# Patient Record
Sex: Male | Born: 1980 | Race: Black or African American | Hispanic: No | Marital: Married | Smoking: Former smoker
Health system: Southern US, Community
[De-identification: ages and names within clinical notes are randomized; demographics above are authoritative.]

---

## 2004-10-30 ENCOUNTER — Emergency Department: Payer: Self-pay | Admitting: Emergency Medicine

## 2006-09-12 ENCOUNTER — Emergency Department: Payer: Self-pay | Admitting: Emergency Medicine

## 2020-07-02 ENCOUNTER — Emergency Department: Payer: BC Managed Care – PPO

## 2020-07-02 ENCOUNTER — Encounter: Payer: Self-pay | Admitting: Emergency Medicine

## 2020-07-02 ENCOUNTER — Emergency Department
Admission: EM | Admit: 2020-07-02 | Discharge: 2020-07-02 | Disposition: A | Discharge status: Home / Self Care | Payer: BC Managed Care – PPO | Attending: Emergency Medicine | Admitting: Emergency Medicine

## 2020-07-02 ENCOUNTER — Other Ambulatory Visit: Payer: Self-pay

## 2020-07-02 DIAGNOSIS — I10 Essential (primary) hypertension: Secondary | ICD-10-CM | POA: Diagnosis not present

## 2020-07-02 DIAGNOSIS — R0602 Shortness of breath: Secondary | ICD-10-CM | POA: Diagnosis not present

## 2020-07-02 DIAGNOSIS — R079 Chest pain, unspecified: Secondary | ICD-10-CM | POA: Diagnosis present

## 2020-07-02 LAB — CBC
HCT: 47.3 % (ref 39.0–52.0)
Hemoglobin: 15.6 g/dL (ref 13.0–17.0)
MCH: 27 pg (ref 26.0–34.0)
MCHC: 33 g/dL (ref 30.0–36.0)
MCV: 82 fL (ref 80.0–100.0)
Platelets: 396 10*3/uL (ref 150–400)
RBC: 5.77 MIL/uL (ref 4.22–5.81)
RDW: 13.6 % (ref 11.5–15.5)
WBC: 7.4 10*3/uL (ref 4.0–10.5)
nRBC: 0 % (ref 0.0–0.2)

## 2020-07-02 LAB — BASIC METABOLIC PANEL
Anion gap: 11 (ref 5–15)
BUN: 10 mg/dL (ref 6–20)
CO2: 25 mmol/L (ref 22–32)
Calcium: 9.5 mg/dL (ref 8.9–10.3)
Chloride: 99 mmol/L (ref 98–111)
Creatinine, Ser: 1.16 mg/dL (ref 0.61–1.24)
GFR calc Af Amer: >60 mL/min (ref >60)
GFR calc non Af Amer: >60 mL/min (ref >60)
Glucose, Bld: 144 mg/dL — ABNORMAL HIGH (ref 70–99)
Potassium: 3.8 mmol/L (ref 3.5–5.1)
Sodium: 135 mmol/L (ref 135–145)

## 2020-07-02 LAB — TROPONIN I (HIGH SENSITIVITY)
Troponin I (High Sensitivity): 39 ng/L — ABNORMAL HIGH (ref <18)
Troponin I (High Sensitivity): 39 ng/L — ABNORMAL HIGH (ref <18)

## 2020-07-02 LAB — BRAIN NATRIURETIC PEPTIDE: B Natriuretic Peptide: 270.1 pg/mL — ABNORMAL HIGH (ref 0.0–100.0)

## 2020-07-02 MED ORDER — METOPROLOL TARTRATE 50 MG PO TABS
50.0000 mg | ORAL_TABLET | Freq: Once | ORAL | Status: AC
  Administered 2020-07-02: 50 mg via ORAL
  Filled 2020-07-02: qty 1

## 2020-07-02 MED ORDER — METOPROLOL SUCCINATE ER 50 MG PO TB24: 50.0000 mg | ORAL_TABLET | Freq: Every day | ORAL | 11 refills | Status: AC

## 2020-07-02 MED ORDER — IOHEXOL 350 MG/ML SOLN
75.0000 mL | Freq: Once | INTRAVENOUS | Status: AC | PRN
  Administered 2020-07-02: 75 mL via INTRAVENOUS

## 2020-07-02 MED ORDER — LABETALOL HCL 5 MG/ML IV SOLN
20.0000 mg | Freq: Once | INTRAVENOUS | Status: AC
  Administered 2020-07-02: 20 mg via INTRAVENOUS
  Filled 2020-07-02: qty 4

## 2020-07-02 MED ORDER — METOPROLOL TARTRATE 5 MG/5ML IV SOLN
5.0000 mg | Freq: Once | INTRAVENOUS | Status: AC
  Administered 2020-07-02: 5 mg via INTRAVENOUS
  Filled 2020-07-02: qty 5

## 2020-07-02 NOTE — ED Triage Notes (Signed)
Pt reports Tuesday he started getting an uncomfortable feeling in his chest/mid epigastric area when he lays flat. Pt denies pain, states that it is an off feeling and he can't explain it. Pt denies SOB, NV or other sx's.

## 2020-07-02 NOTE — ED Provider Notes (Signed)
[facility] Emergency Department Provider Note  Time seen: 12:57 PM  I have reviewed the triage vital signs and the nursing notes.   HISTORY  Chief Complaint Chest Pain   HPI Ronald Nguyen is a 39 y.o. male with no past medical history who presents to the emergency department for shortness of breath.  According to the patient over the past 2 to 3 days he has been feeling more short of breath especially with exertion or if he tries to lie down.  States for the last 2 nights he has been sleeping in a recliner due to shortness of breath when lying down.  Patient denies any chest pain.  Denies abdominal pain denies lower extremity pain or swelling.  Patient is somewhat tachycardic around 130 bpm upon arrival as well.  Denies any fever or cough.  Patient states a history of hypertension but does not take any medications nor sees a PCP.   History reviewed. No pertinent past medical history.  There are no problems to display for this patient.   History reviewed. No pertinent surgical history.  Prior to Admission medications   Not on File    Not on File  No family history on file.  Social History Social History   Tobacco Use  . Smoking status: Not on file  Substance Use Topics  . Alcohol use: Not on file  . Drug use: Not on file    Review of Systems Constitutional: Negative for fever Cardiovascular: Negative for chest pain. Respiratory: Worsening shortness of breath x3 to 4 days. Gastrointestinal: Negative for abdominal pain, vomiting Musculoskeletal: Negative for musculoskeletal complaints Neurological: Negative for headache All other ROS negative  ____________________________________________   PHYSICAL EXAM:  VITAL SIGNS: ED Triage Vitals  Enc Vitals Group     BP 07/02/20 0823 (!) 186/128     Pulse Rate 07/02/20 0823 (!) 142     Resp 07/02/20 0823 20     Temp 07/02/20 0823 97.7 F (36.5 C)     Temp Source 07/02/20 0823 Oral     SpO2  07/02/20 0823 99 %     Weight 07/02/20 0821 196 lb (88.9 kg)     Height 07/02/20 0821 5\' 6"  (1.676 m)     Head Circumference --      Peak Flow --      Pain Score 07/02/20 0821 0     Pain Loc --      Pain Edu? --      Excl. in GC? --     Constitutional: Alert and oriented. Well appearing and in no distress. Eyes: Normal exam ENT      Head: Normocephalic and atraumatic.      Mouth/Throat: Mucous membranes are moist. Cardiovascular: Normal rate, regular rhythm.  Respiratory: Normal respiratory effort without tachypnea nor retractions. Breath sounds are clear  Gastrointestinal: Soft and nontender. No distention.   Musculoskeletal: Nontender with normal range of motion in all extremities.  Neurologic:  Normal speech and language. No gross focal neurologic deficits  Skin:  Skin is warm, dry and intact.  Psychiatric: Mood and affect are normal.   ____________________________________________    EKG  EKG viewed and interpreted by myself shows sinus tachycardia 134 bpm with a narrow QRS, normal axis, normal intervals nonspecific ST changes.  ____________________________________________    RADIOLOGY  Chest x-ray is negative. CT scan of the chest shows no PE.  ____________________________________________   INITIAL IMPRESSION / ASSESSMENT AND PLAN / ED COURSE  Pertinent labs & imaging  results that were available during my care of the patient were reviewed by me and considered in my medical decision making (see chart for details).   Patient presents emergency department for shortness of breath worse over the past 3 to 4 days.  Patient's work-up shows a slightly elevated troponin however on repeat troponin it is largely unchanged 2 hours later.  Patient's chest x-ray is clear however given his tachycardia and shortness of breath we will proceed with CTA imaging to rule out PE or interstitial edema.  Patient CT scan is negative remainder of the lab work is largely nonrevealing.   Patient's blood pressure remains significantly elevated 180/130.  Highly suspect the patient's shortness of breath is due to significant diastolic elevation with increased afterload and demand ischemia.  I spoke to Dr. Lady Gary of cardiology who agrees.  Wishes to start the patient on 50 mg of Toprol-XL daily and have the patient follow-up tomorrow morning at 10 AM in his office.  I spoke to the patient he is agreeable to this plan of care.  I discussed return precautions as well.  CT does show a 5 mm nodule, patient is low risk no follow-up required.  Ronald Nguyen was evaluated in Emergency Department on 07/02/2020 for the symptoms described in the history of present illness. He was evaluated in the context of the global COVID-19 pandemic, which necessitated consideration that the patient might be at risk for infection with the SARS-CoV-2 virus that causes COVID-19. Institutional protocols and algorithms that pertain to the evaluation of patients at risk for COVID-19 are in a state of rapid change based on information released by regulatory bodies including the CDC and federal and state organizations. These policies and algorithms were followed during the patient's care in the ED.  ____________________________________________   FINAL CLINICAL IMPRESSION(S) / ED DIAGNOSES  Hypertension   Minna Antis, MD 07/02/20 1302

## 2020-07-02 NOTE — ED Notes (Signed)
Patient transported to CT 

## 2020-07-03 DIAGNOSIS — I10 Essential (primary) hypertension: Secondary | ICD-10-CM

## 2020-07-03 HISTORY — DX: Essential (primary) hypertension: I10

## 2020-08-06 DIAGNOSIS — I429 Cardiomyopathy, unspecified: Secondary | ICD-10-CM

## 2020-08-06 HISTORY — DX: Cardiomyopathy, unspecified: I42.9

## 2021-05-04 IMAGING — CR DG CHEST 2V
2 series · 2 of 2 positions shown · non-contrast
Comparison: None.

CLINICAL DATA: Chest discomfort

EXAM:
CHEST - 2 VIEW

[chest pa]
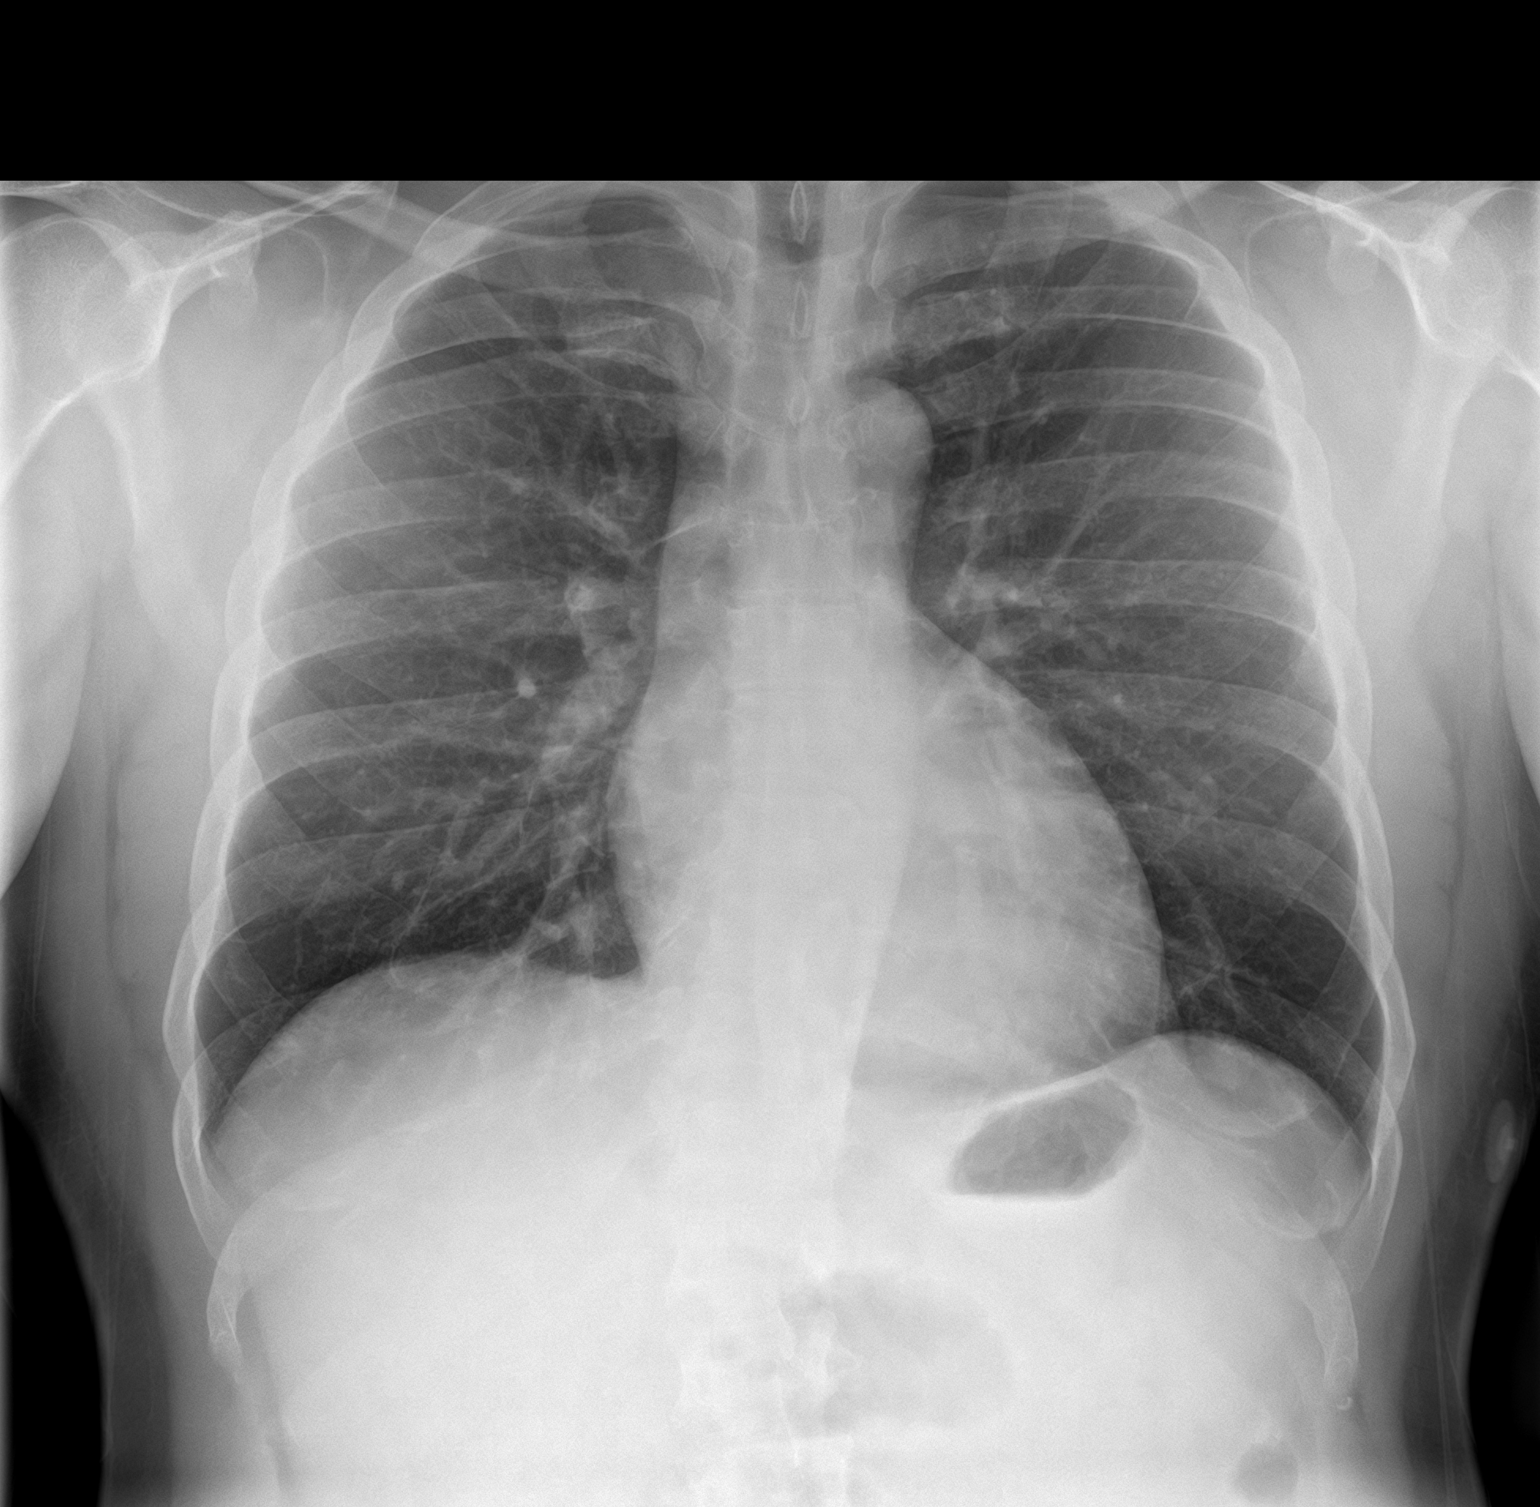

[chest lat]
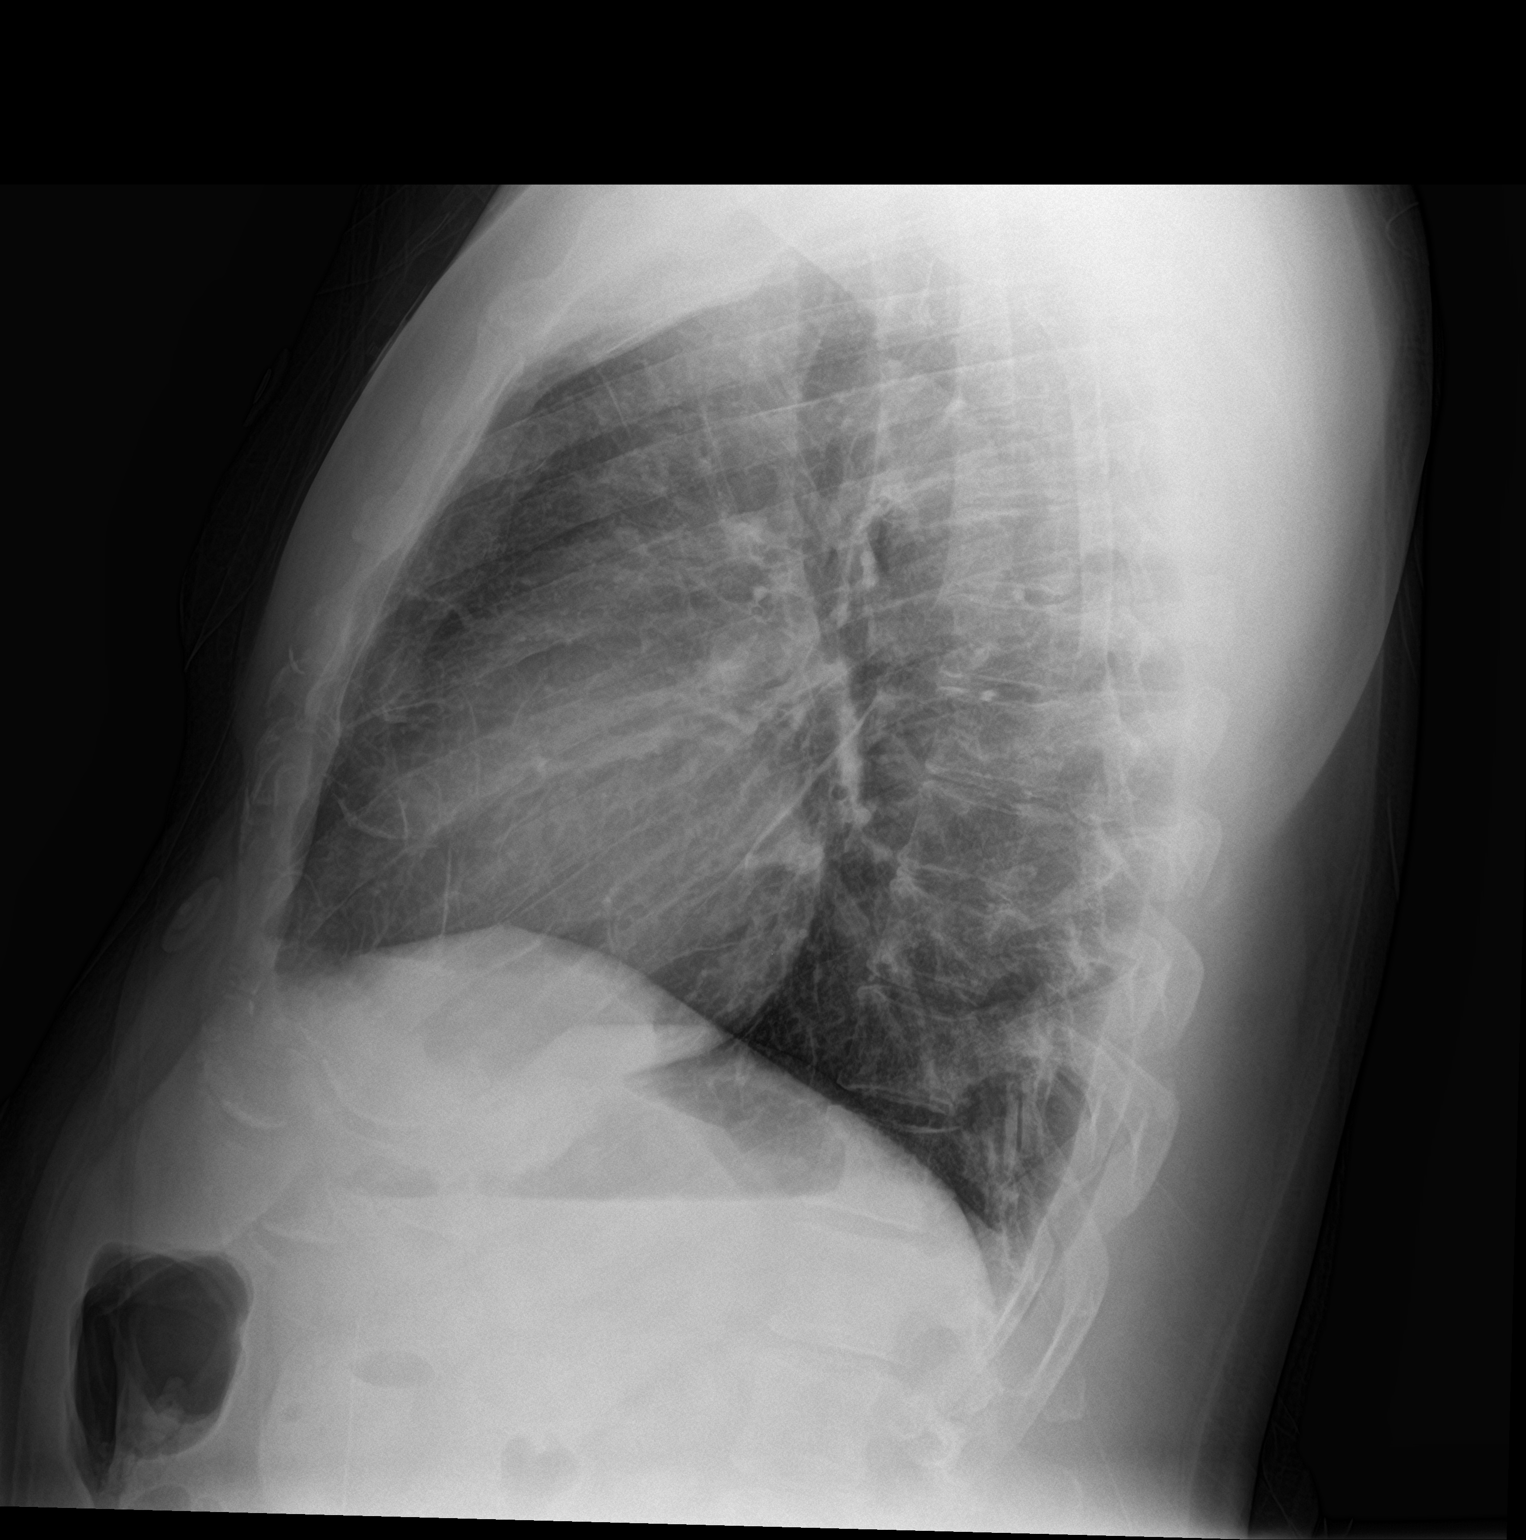

[2 of 2 positions shown; findings below may reference images not displayed]

FINDINGS: Linear densities in the lung bases, likely scarring. No confluent
opacities, effusions or acute bony abnormality. Heart is normal
size.
IMPRESSION: No active disease.

## 2021-05-04 IMAGING — CT CT ANGIO CHEST
2 of 7 series · 19 of 46 positions shown · IV contrast (APPLIED)
Comparison: None.

CLINICAL DATA: Chest pain.

EXAM:
CT ANGIOGRAPHY CHEST WITH CONTRAST
TECHNIQUE: Multidetector CT imaging of the chest was performed using the
standard protocol during bolus administration of intravenous
contrast. Multiplanar CT image reconstructions and MIPs were
obtained to evaluate the vascular anatomy.
CONTRAST:  75mL OMNIPAQUE IOHEXOL 350 MG/ML SOLN

[Series 5: thins · axial · 0.63mm/px · z∈[+24,+280]mm · 16 of 289 slices shown]
[im 17/289  lung]
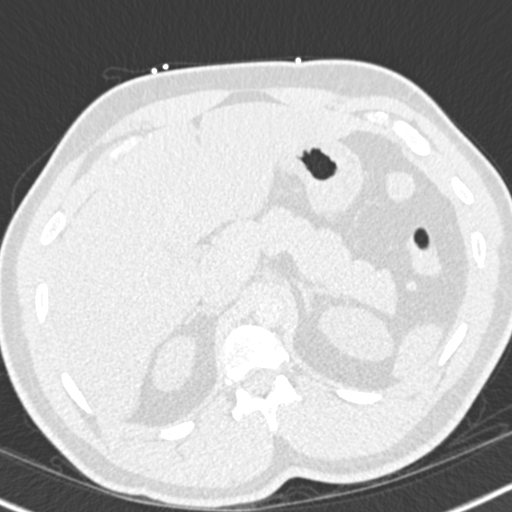
[im 33/289  soft-tissue]
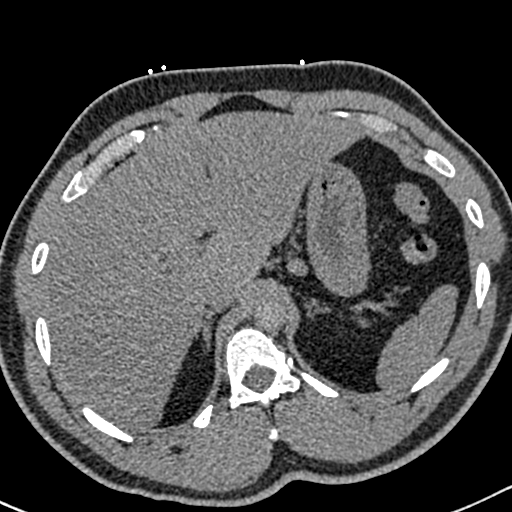
[im 49/289  lung]
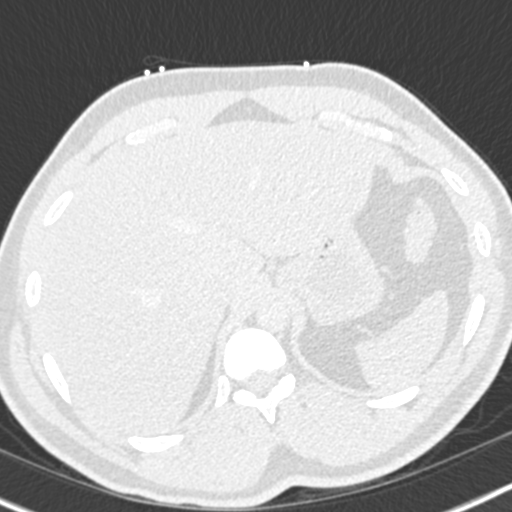
[im 65/289  soft-tissue]
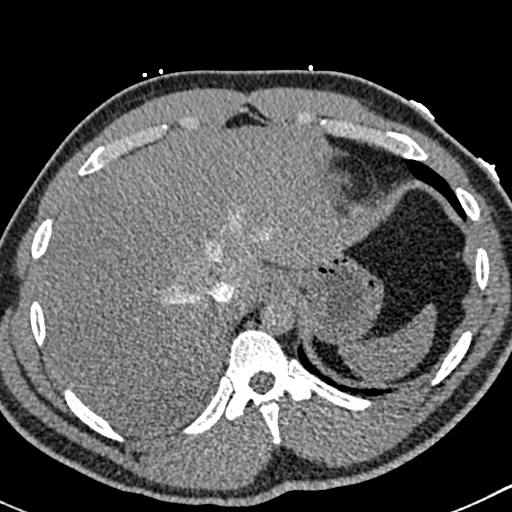
[im 81/289  lung]
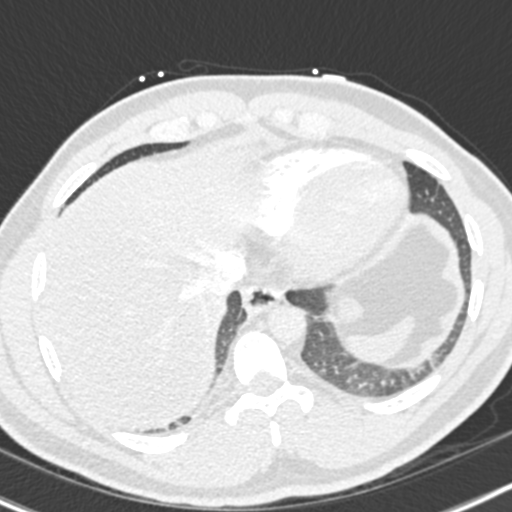
[im 97/289  soft-tissue]
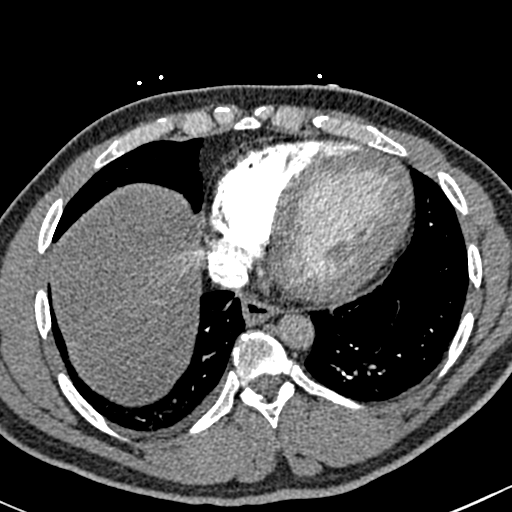
[im 113/289  lung]
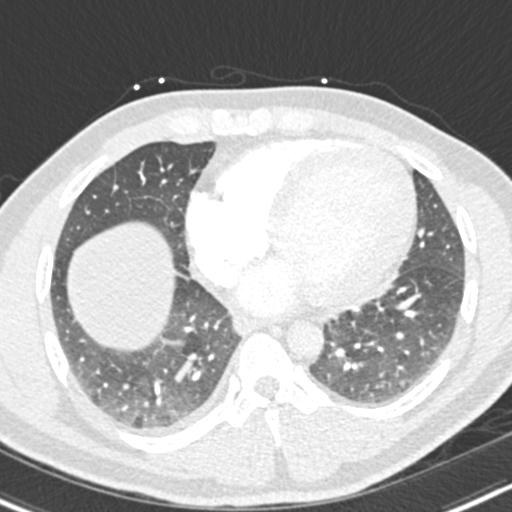
[im 129/289  soft-tissue]
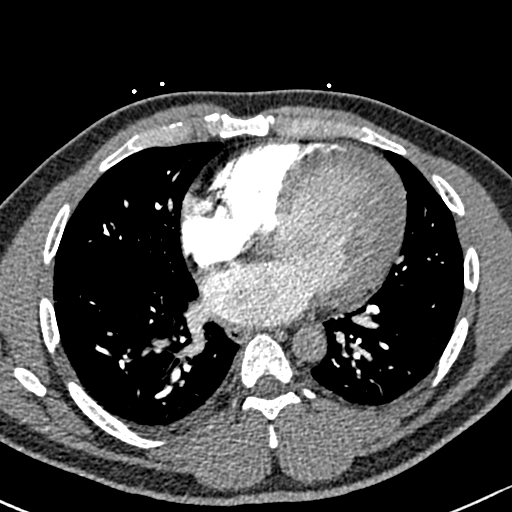
[im 161/289  lung]
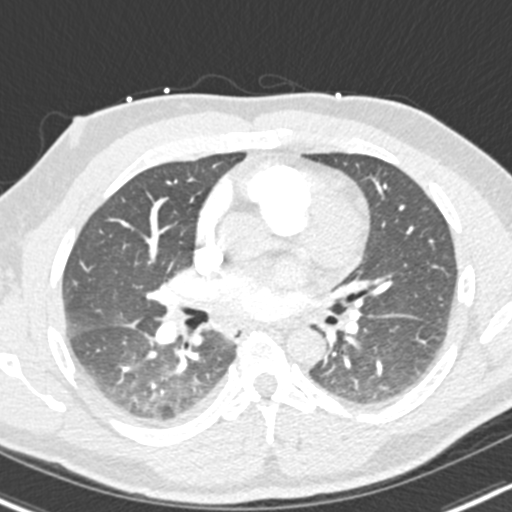
[im 177/289  soft-tissue]
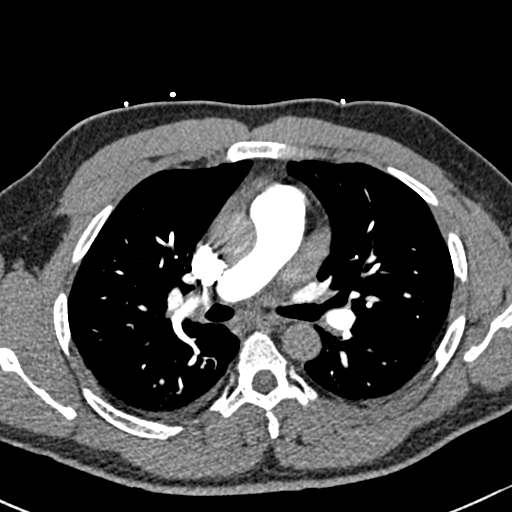
[im 193/289  lung]
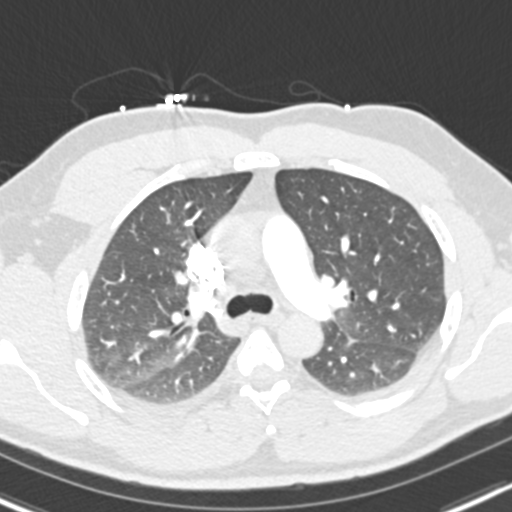
[im 209/289  soft-tissue]
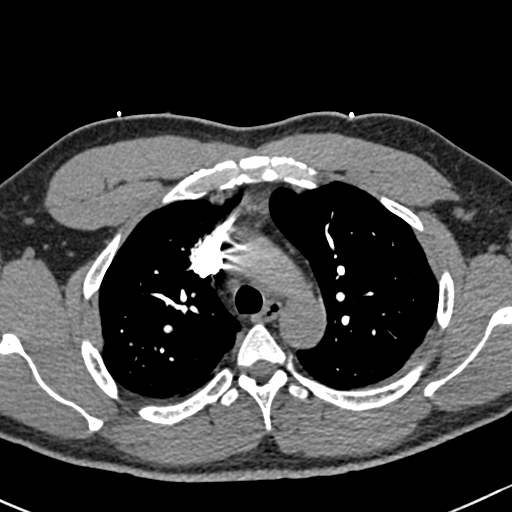
[im 225/289  lung]
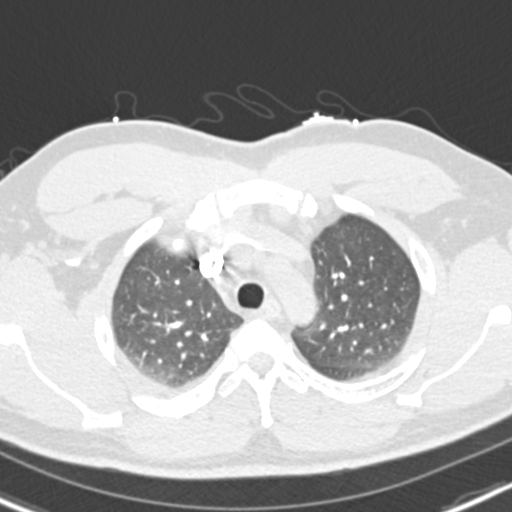
[im 241/289  soft-tissue]
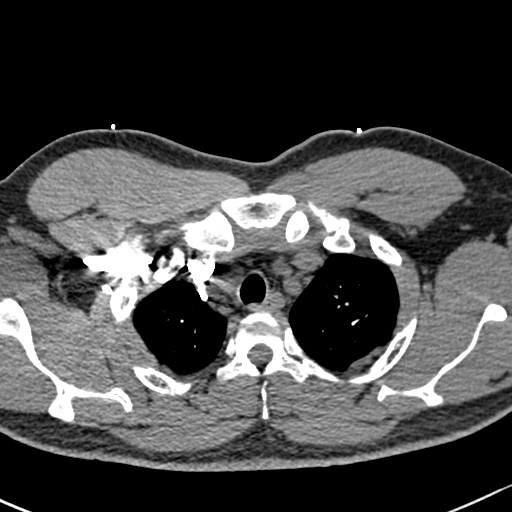
[im 257/289  lung]
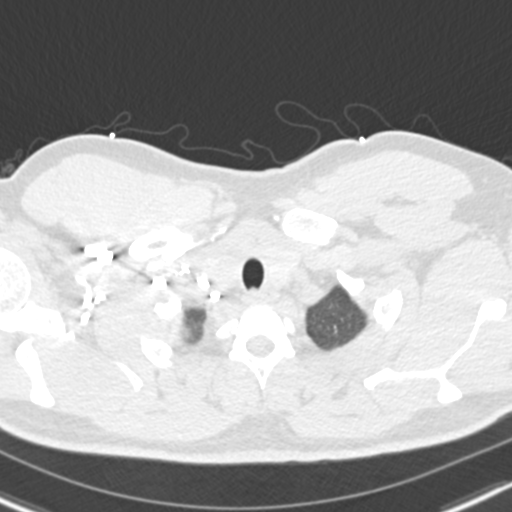
[im 273/289  soft-tissue]
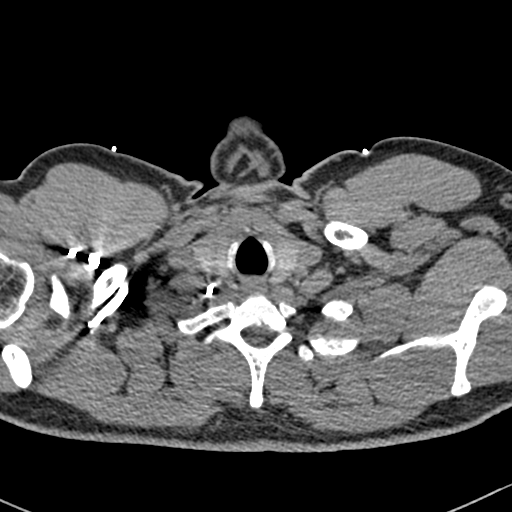

[Series 7: coronal mpr · coronal · 0.56mm/px · 3 of 87 slices shown]
[im 22/87  soft-tissue]
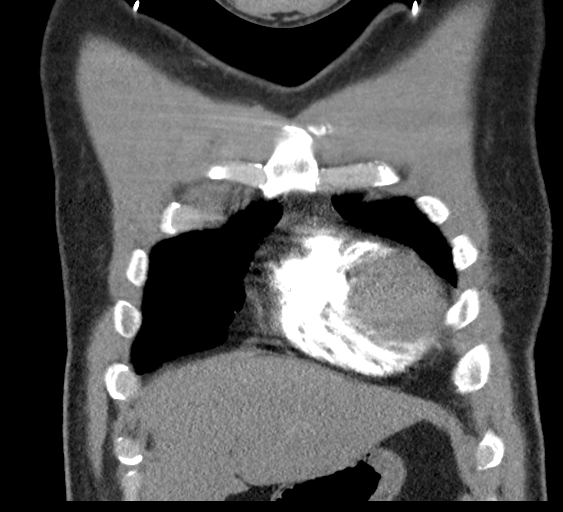
[im 44/87  soft-tissue]
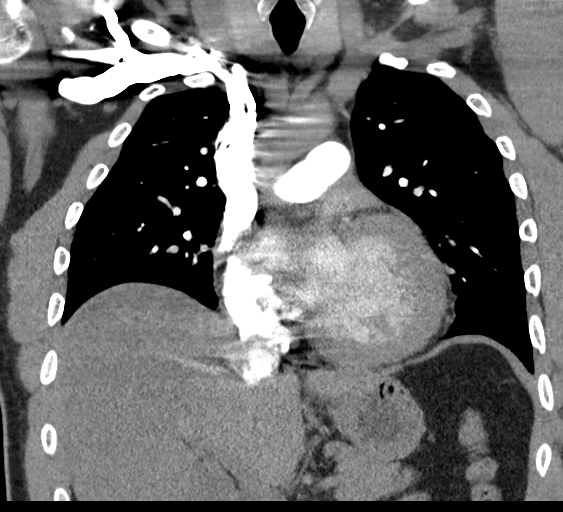
[im 65/87  soft-tissue]
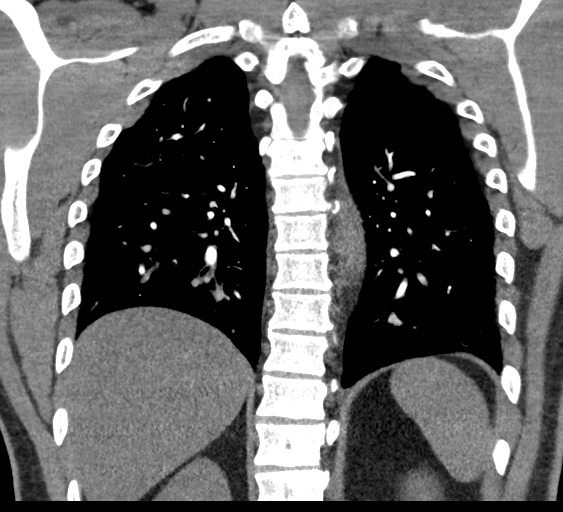

[19 of 46 positions shown; findings below may reference images not displayed]

FINDINGS: Cardiovascular: Satisfactory opacification of the pulmonary arteries
to the segmental level. No evidence of pulmonary embolism. Normal
heart size. No pericardial effusion.

Mediastinum/Nodes: No enlarged mediastinal, hilar, or axillary lymph
nodes. Thyroid gland, trachea, and esophagus demonstrate no
significant findings.

Lungs/Pleura: No pneumothorax or pleural effusion is noted. 5 mm
nodule is noted in right middle lobe best seen on image number 52 of
series 6. Left lung is clear.

Upper Abdomen: No acute abnormality.

Musculoskeletal: No chest wall abnormality. No acute or significant
osseous findings.

Review of the MIP images confirms the above findings.
IMPRESSION: 1. No definite evidence of pulmonary embolus.
2. 5 mm right middle lobe nodule is noted. No follow-up needed if
patient is low-risk. Non-contrast chest CT can be considered in 12
months if patient is high-risk. This recommendation follows the
consensus statement: Guidelines for Management of Incidental
Pulmonary Nodules Detected on CT Images: From the [HOSPITAL]

## 2022-06-22 LAB — MICROALBUMIN / CREATININE URINE RATIO

## 2022-06-22 LAB — PROTEIN / CREATININE RATIO, URINE
Albumin, U: 30
Creatinine, Urine: 100

## 2022-09-21 LAB — HEPATIC FUNCTION PANEL
ALT: 32 U/L (ref 10–40)
AST: 26 (ref 14–40)
Alkaline Phosphatase: 74 (ref 25–125)
Bilirubin, Total: 0.4

## 2022-09-21 LAB — CBC AND DIFFERENTIAL
HCT: 40 — AB (ref 41–53)
Hemoglobin: 13.8 (ref 13.5–17.5)
Neutrophils Absolute: 2.4
Platelets: 325 10*3/uL (ref 150–400)
WBC: 4.1

## 2022-09-21 LAB — COMPREHENSIVE METABOLIC PANEL
Albumin: 5.1 — AB (ref 3.5–5.0)
Calcium: 9.8 (ref 8.7–10.7)
Globulin: 3.1
eGFR: 88

## 2022-09-21 LAB — BASIC METABOLIC PANEL
BUN: 9 (ref 4–21)
CO2: 23 — AB (ref 13–22)
Chloride: 99 (ref 99–108)
Creatinine: 1.1 (ref 0.6–1.3)
Glucose: 119
Potassium: 4.6 mEq/L (ref 3.5–5.1)
Sodium: 136 — AB (ref 137–147)

## 2022-09-21 LAB — CBC: RBC: 4.97 (ref 3.87–5.11)

## 2022-09-21 LAB — LIPID PANEL
Cholesterol: 152 (ref 0–200)
HDL: 30 — AB (ref 35–70)
LDL Cholesterol: 97
Triglycerides: 140 (ref 40–160)

## 2022-09-21 LAB — HEMOGLOBIN A1C: Hemoglobin A1C: 7.2

## 2022-09-21 LAB — VITAMIN D 25 HYDROXY (VIT D DEFICIENCY, FRACTURES): Vit D, 25-Hydroxy: 54.6

## 2022-12-07 ENCOUNTER — Encounter: Payer: Self-pay | Admitting: Family

## 2022-12-07 ENCOUNTER — Ambulatory Visit
Admission: RE | Admit: 2022-12-07 | Discharge: 2022-12-07 | Disposition: A | Discharge status: Home / Self Care | Payer: BC Managed Care – PPO | Attending: Family | Admitting: Family

## 2022-12-07 ENCOUNTER — Ambulatory Visit
Admission: RE | Admit: 2022-12-07 | Discharge: 2022-12-07 | Disposition: A | Discharge status: Home / Self Care | Payer: BC Managed Care – PPO | Source: Ambulatory Visit | Attending: Family | Admitting: Family

## 2022-12-07 ENCOUNTER — Ambulatory Visit: Payer: BC Managed Care – PPO | Admitting: Family

## 2022-12-07 VITALS — BP 132/78 | HR 74 | Ht 66.0 in | Wt 199.0 lb

## 2022-12-07 DIAGNOSIS — R051 Acute cough: Secondary | ICD-10-CM | POA: Diagnosis not present

## 2022-12-07 DIAGNOSIS — R0600 Dyspnea, unspecified: Secondary | ICD-10-CM | POA: Diagnosis not present

## 2022-12-07 DIAGNOSIS — Z6832 Body mass index (BMI) 32.0-32.9, adult: Secondary | ICD-10-CM

## 2022-12-07 DIAGNOSIS — E782 Mixed hyperlipidemia: Secondary | ICD-10-CM

## 2022-12-07 DIAGNOSIS — E1165 Type 2 diabetes mellitus with hyperglycemia: Secondary | ICD-10-CM

## 2022-12-07 DIAGNOSIS — I429 Cardiomyopathy, unspecified: Secondary | ICD-10-CM

## 2022-12-07 HISTORY — DX: Type 2 diabetes mellitus with hyperglycemia: E11.65

## 2022-12-07 HISTORY — DX: Mixed hyperlipidemia: E78.2

## 2022-12-07 MED ORDER — BENZONATATE 100 MG PO CAPS: 100.0000 mg | ORAL_CAPSULE | Freq: Three times a day (TID) | ORAL | 1 refills | Status: DC | PRN

## 2022-12-07 MED ORDER — LEVOFLOXACIN 500 MG PO TABS: 500.0000 mg | ORAL_TABLET | Freq: Every day | ORAL | 0 refills | Status: AC

## 2022-12-07 NOTE — Progress Notes (Signed)
Acute Office Visit  Subjective:     Patient ID: Ronald Nguyen, male    DOB: [birth year], 42 y.o.   MRN: [id number]  Chief Complaint  Patient presents with   Follow-up    Cough    Cough This is a new problem. The current episode started 1 to 4 weeks ago. The problem has been waxing and waning. The problem occurs every few minutes. The cough is Productive of sputum. Associated symptoms include nasal congestion and rhinorrhea. Nothing aggravates the symptoms.   Patient is in today for  Chief Complaint  Patient presents with   Follow-up    Cough    Review of Systems  HENT:  Positive for rhinorrhea.   Respiratory:  Positive for cough.         Objective:    BP 132/78   Pulse 74   Ht 5' 6"$  (1.676 m)   Wt 199 lb (90.3 kg)   SpO2 98%   BMI 32.12 kg/m   Physical Exam  Results for orders placed or performed in visit on 12/07/22  CBC and differential  Result Value Ref Range   Hemoglobin 13.8 13.5 - 17.5   HCT 40 (A) 41 - 53   Neutrophils Absolute 2.40    Platelets 325 150 - 400 K/uL   WBC 4.1   CBC  Result Value Ref Range   RBC 4.97 3.87 - 5.11  VITAMIN D 25 Hydroxy (Vit-D Deficiency, Fractures)  Result Value Ref Range   Vit D, 25-Hydroxy 54.6   Microalbumin / creatinine urine ratio  Result Value Ref Range   Microalb Creat Ratio 30-300   Protein / creatinine ratio, urine  Result Value Ref Range   Creatinine, Urine 100    Albumin, U 30   Basic metabolic panel  Result Value Ref Range   Glucose 119    BUN 9 4 - 21   CO2 23 (A) 13 - 22   Creatinine 1.1 0.6 - 1.3   Potassium 4.6 3.5 - 5.1 mEq/L   Sodium 136 (A) 137 - 147   Chloride 99 99 - 108  Comprehensive metabolic panel  Result Value Ref Range   Globulin 3.1    eGFR 88    Calcium 9.8 8.7 - 10.7   Albumin 5.1 (A) 3.5 - 5.0  Lipid panel  Result Value Ref Range   Triglycerides 140 40 - 160   Cholesterol 152 0 - 200   HDL 30 (A) 35 - 70   LDL Cholesterol 97   Hepatic function panel  Result  Value Ref Range   Alkaline Phosphatase 74 25 - 125   ALT 32 10 - 40 U/L   AST 26 14 - 40   Bilirubin, Total 0.4   Hemoglobin A1c  Result Value Ref Range   Hemoglobin A1C 7.2         Assessment & Plan:   Problem List Items Addressed This Visit     Idiopathic cardiomyopathy (HCC)   Relevant Medications   carvedilol (COREG) 25 MG tablet   CORLANOR 5 MG TABS tablet   rosuvastatin (CRESTOR) 40 MG tablet   ENTRESTO 97-103 MG   spironolactone (ALDACTONE) 25 MG tablet   Type 2 diabetes mellitus with hyperglycemia (HCC)   Relevant Medications   FARXIGA 10 MG TABS tablet   rosuvastatin (CRESTOR) 40 MG tablet   Mixed hyperlipidemia   Relevant Medications   carvedilol (COREG) 25 MG tablet   CORLANOR 5 MG TABS tablet   rosuvastatin (CRESTOR) 40  MG tablet   ENTRESTO 97-103 MG   spironolactone (ALDACTONE) 25 MG tablet   Other Visit Diagnoses     Acute cough    -  Primary   Relevant Medications   levofloxacin (LEVAQUIN) 500 MG tablet   benzonatate (TESSALON PERLES) 100 MG capsule   Other Relevant Orders   DG Chest 2 View   Dyspnea, unspecified type       Relevant Medications   levofloxacin (LEVAQUIN) 500 MG tablet   benzonatate (TESSALON PERLES) 100 MG capsule   Other Relevant Orders   DG Chest 2 View   Body mass index (BMI) of 32.0-32.9 in adult            No follow-ups on file.  Total time spent: 20 minutes  Mechele Claude, FNP  12/07/2022

## 2022-12-08 ENCOUNTER — Encounter: Payer: Self-pay | Admitting: Family

## 2022-12-21 ENCOUNTER — Ambulatory Visit: Payer: BC Managed Care – PPO | Admitting: Family

## 2022-12-28 ENCOUNTER — Ambulatory Visit: Payer: BC Managed Care – PPO | Admitting: Family

## 2023-01-04 ENCOUNTER — Ambulatory Visit: Payer: BC Managed Care – PPO | Admitting: Family

## 2023-01-11 ENCOUNTER — Encounter: Payer: Self-pay | Admitting: Family

## 2023-01-11 ENCOUNTER — Ambulatory Visit: Payer: BC Managed Care – PPO | Admitting: Family

## 2023-01-11 VITALS — BP 142/86 | HR 77 | Ht 66.0 in | Wt 206.6 lb

## 2023-01-11 DIAGNOSIS — Z6832 Body mass index (BMI) 32.0-32.9, adult: Secondary | ICD-10-CM | POA: Diagnosis not present

## 2023-01-11 DIAGNOSIS — R5383 Other fatigue: Secondary | ICD-10-CM

## 2023-01-11 DIAGNOSIS — E782 Mixed hyperlipidemia: Secondary | ICD-10-CM | POA: Diagnosis not present

## 2023-01-11 DIAGNOSIS — E538 Deficiency of other specified B group vitamins: Secondary | ICD-10-CM

## 2023-01-11 DIAGNOSIS — I10 Essential (primary) hypertension: Secondary | ICD-10-CM | POA: Diagnosis not present

## 2023-01-11 DIAGNOSIS — E1165 Type 2 diabetes mellitus with hyperglycemia: Secondary | ICD-10-CM

## 2023-01-11 DIAGNOSIS — E559 Vitamin D deficiency, unspecified: Secondary | ICD-10-CM

## 2023-01-11 DIAGNOSIS — I519 Heart disease, unspecified: Secondary | ICD-10-CM

## 2023-01-11 NOTE — Progress Notes (Signed)
Established Patient Office Visit  Subjective:  Patient ID: Ronald Nguyen, male    DOB: [birth year]  Age: 42 y.o. MRN: [id number]  Chief Complaint  Patient presents with   Follow-up    3 month follow up    Patient is here today for his 3 month follow up.  He has been feeling fairly well since last appointment.   He does not have additional concerns to discuss today.  Labs are due today. He needs refills.   I have reviewed his active problem list, medication list, allergies, notes from last encounter, and lab results for his appointment today.   No other concerns at this time.   Past Medical History:  Diagnosis Date   Hypertension, essential 07/03/2020   Idiopathic cardiomyopathy ([place]) 08/06/2020   Mixed hyperlipidemia 12/07/2022   Type 2 diabetes mellitus with hyperglycemia ([place]) 12/07/2022    History reviewed. No pertinent surgical history.  Social History   Socioeconomic History   Marital status: Married    Spouse name: Not on file   Number of children: Not on file   Years of education: Not on file   Highest education level: Not on file  Occupational History   Not on file  Tobacco Use   Smoking status: Former    Types: Cigarettes   Smokeless tobacco: Former  Substance and Sexual Activity   Alcohol use: Not on file   Drug use: Not on file   Sexual activity: Not on file  Other Topics Concern   Not on file  Social History Narrative   Not on file   Social Determinants of Health   Financial Resource Strain: Not on file  Food Insecurity: Not on file  Transportation Needs: Not on file  Physical Activity: Not on file  Stress: Not on file  Social Connections: Not on file  Intimate Partner Violence: Not on file    History reviewed. No pertinent family history.  No Known Allergies  Review of Systems  All other systems reviewed and are negative.      Objective:   BP (!) 142/86   Pulse 77   Ht 5\' 6"  (1.676 m)   Wt 206 lb 9.6 oz (93.7 kg)    SpO2 98%   BMI 33.35 kg/m   Vitals:   01/11/23 1007  BP: (!) 142/86  Pulse: 77  Height: 5\' 6"  (1.676 m)  Weight: 206 lb 9.6 oz (93.7 kg)  SpO2: 98%  BMI (Calculated): 33.36    Physical Exam Vitals and nursing note reviewed.  Constitutional:      Appearance: Normal appearance. He is normal weight.  Eyes:     Pupils: Pupils are equal, round, and reactive to light.  Cardiovascular:     Rate and Rhythm: Normal rate and regular rhythm.     Pulses: Normal pulses.     Heart sounds: Normal heart sounds.  Pulmonary:     Effort: Pulmonary effort is normal.     Breath sounds: Normal breath sounds.  Neurological:     Mental Status: He is alert.  Psychiatric:        Mood and Affect: Mood normal.        Behavior: Behavior normal.      No results found for any visits on 01/11/23.  No results found for this or any previous visit (from the past 2160 hour(s)).    Assessment & Plan:   Problem List Items Addressed This Visit     Hypertension, essential   Relevant Orders  CBC With Differential   CMP14+EGFR   Type 2 diabetes mellitus with hyperglycemia (HCC)   Relevant Orders   CBC With Differential   CMP14+EGFR   Hemoglobin A1c   Ambulatory referral to Ophthalmology   Mixed hyperlipidemia - Primary   Relevant Orders   Lipid panel   CBC With Differential   CMP14+EGFR   Severe left ventricular systolic dysfunction   Other Visit Diagnoses     Body mass index (BMI) of 32.0-32.9 in adult       Relevant Orders   CBC With Differential   CMP14+EGFR   B12 deficiency due to diet       Relevant Orders   CBC With Differential   CMP14+EGFR   Vitamin B12   Other fatigue       Relevant Orders   CBC With Differential   CMP14+EGFR   TSH   Vitamin D deficiency, unspecified       Relevant Orders   VITAMIN D 25 Hydroxy (Vit-D Deficiency, Fractures)   CBC With Differential   CMP14+EGFR       Return in about 3 months (around 04/13/2023).   Total time spent: 20  minutes  Mechele Claude, FNP  01/11/2023

## 2023-01-12 LAB — CBC WITH DIFFERENTIAL
Basophils Absolute: 0 10*3/uL (ref 0.0–0.2)
Basos: 1 %
EOS (ABSOLUTE): 0 10*3/uL (ref 0.0–0.4)
Eos: 1 %
Hematocrit: 42.8 % (ref 37.5–51.0)
Hemoglobin: 13.9 g/dL (ref 13.0–17.7)
Immature Grans (Abs): 0 10*3/uL (ref 0.0–0.1)
Immature Granulocytes: 0 %
Lymphocytes Absolute: 1.4 10*3/uL (ref 0.7–3.1)
Lymphs: 32 %
MCH: 27.4 pg (ref 26.6–33.0)
MCHC: 32.5 g/dL (ref 31.5–35.7)
MCV: 84 fL (ref 79–97)
Monocytes Absolute: 0.3 10*3/uL (ref 0.1–0.9)
Monocytes: 7 %
Neutrophils Absolute: 2.7 10*3/uL (ref 1.4–7.0)
Neutrophils: 59 %
RBC: 5.08 x10E6/uL (ref 4.14–5.80)
RDW: 13.2 % (ref 11.6–15.4)
WBC: 4.4 10*3/uL (ref 3.4–10.8)

## 2023-01-12 LAB — HEMOGLOBIN A1C
Est. average glucose Bld gHb Est-mCnc: 157 mg/dL
Hgb A1c MFr Bld: 7.1 % — ABNORMAL HIGH (ref 4.8–5.6)

## 2023-01-12 LAB — CMP14+EGFR
ALT: 43 IU/L (ref 0–44)
AST: 23 IU/L (ref 0–40)
Albumin/Globulin Ratio: 1.3 (ref 1.2–2.2)
Albumin: 4.5 g/dL (ref 4.1–5.1)
Alkaline Phosphatase: 69 IU/L (ref 44–121)
BUN/Creatinine Ratio: 13 (ref 9–20)
BUN: 13 mg/dL (ref 6–24)
Bilirubin Total: 0.6 mg/dL (ref 0.0–1.2)
CO2: 23 mmol/L (ref 20–29)
Calcium: 10 mg/dL (ref 8.7–10.2)
Chloride: 96 mmol/L (ref 96–106)
Creatinine, Ser: 0.99 mg/dL (ref 0.76–1.27)
Globulin, Total: 3.4 g/dL (ref 1.5–4.5)
Glucose: 113 mg/dL — ABNORMAL HIGH (ref 70–99)
Potassium: 4.7 mmol/L (ref 3.5–5.2)
Sodium: 133 mmol/L — ABNORMAL LOW (ref 134–144)
Total Protein: 7.9 g/dL (ref 6.0–8.5)
eGFR: 98 mL/min/{1.73_m2} (ref >59)

## 2023-01-12 LAB — LIPID PANEL
Chol/HDL Ratio: 4.7 ratio (ref 0.0–5.0)
Cholesterol, Total: 151 mg/dL (ref 100–199)
HDL: 32 mg/dL — ABNORMAL LOW (ref >39)
LDL Chol Calc (NIH): 86 mg/dL (ref 0–99)
Triglycerides: 193 mg/dL — ABNORMAL HIGH (ref 0–149)
VLDL Cholesterol Cal: 33 mg/dL (ref 5–40)

## 2023-01-12 LAB — TSH: TSH: 0.836 u[IU]/mL (ref 0.450–4.500)

## 2023-01-12 LAB — VITAMIN B12: Vitamin B-12: 541 pg/mL (ref 232–1245)

## 2023-01-12 LAB — VITAMIN D 25 HYDROXY (VIT D DEFICIENCY, FRACTURES): Vit D, 25-Hydroxy: 51.8 ng/mL (ref 30.0–100.0)

## 2023-01-20 ENCOUNTER — Telehealth: Payer: Self-pay | Admitting: Family

## 2023-01-20 MED ORDER — CORLANOR 5 MG PO TABS: 5.0000 mg | ORAL_TABLET | Freq: Two times a day (BID) | ORAL | 11 refills | Status: DC

## 2023-01-20 NOTE — Telephone Encounter (Signed)
Patient's wife called in requesting Rx for Corlanor be sent in to [facility] at [facility]

## 2023-01-30 ENCOUNTER — Other Ambulatory Visit: Payer: Self-pay | Admitting: Cardiovascular Disease

## 2023-02-23 ENCOUNTER — Encounter: Payer: Self-pay | Admitting: Family

## 2023-03-30 ENCOUNTER — Other Ambulatory Visit: Payer: Self-pay | Admitting: Cardiovascular Disease

## 2023-04-13 ENCOUNTER — Ambulatory Visit: Payer: BC Managed Care – PPO | Admitting: Family

## 2023-04-24 ENCOUNTER — Other Ambulatory Visit: Payer: Self-pay | Admitting: Cardiovascular Disease

## 2023-06-17 ENCOUNTER — Other Ambulatory Visit: Payer: Self-pay | Admitting: Cardiovascular Disease

## 2023-07-12 ENCOUNTER — Other Ambulatory Visit: Payer: Self-pay | Admitting: Cardiovascular Disease

## 2023-07-29 ENCOUNTER — Other Ambulatory Visit: Payer: Self-pay | Admitting: Cardiovascular Disease

## 2023-08-19 ENCOUNTER — Other Ambulatory Visit: Payer: Self-pay | Admitting: Cardiovascular Disease

## 2023-09-01 ENCOUNTER — Other Ambulatory Visit: Payer: Self-pay | Admitting: Cardiovascular Disease

## 2023-10-25 ENCOUNTER — Other Ambulatory Visit: Payer: Self-pay | Admitting: Family

## 2023-12-11 ENCOUNTER — Other Ambulatory Visit: Payer: Self-pay

## 2023-12-11 ENCOUNTER — Other Ambulatory Visit: Payer: Self-pay | Admitting: Cardiovascular Disease

## 2023-12-12 ENCOUNTER — Other Ambulatory Visit: Payer: Self-pay

## 2023-12-26 ENCOUNTER — Ambulatory Visit: Admitting: Family

## 2023-12-29 ENCOUNTER — Encounter: Payer: Self-pay | Admitting: Cardiovascular Disease

## 2023-12-29 ENCOUNTER — Ambulatory Visit: Admitting: Cardiovascular Disease

## 2023-12-29 VITALS — BP 132/87 | HR 85 | Ht 66.0 in | Wt 194.0 lb

## 2023-12-29 DIAGNOSIS — I429 Cardiomyopathy, unspecified: Secondary | ICD-10-CM

## 2023-12-29 DIAGNOSIS — I10 Essential (primary) hypertension: Secondary | ICD-10-CM | POA: Diagnosis not present

## 2023-12-29 DIAGNOSIS — I519 Heart disease, unspecified: Secondary | ICD-10-CM | POA: Diagnosis not present

## 2023-12-29 DIAGNOSIS — E782 Mixed hyperlipidemia: Secondary | ICD-10-CM | POA: Diagnosis not present

## 2023-12-29 DIAGNOSIS — E1165 Type 2 diabetes mellitus with hyperglycemia: Secondary | ICD-10-CM

## 2023-12-29 DIAGNOSIS — I5022 Chronic systolic (congestive) heart failure: Secondary | ICD-10-CM

## 2023-12-29 MED ORDER — SPIRONOLACTONE 25 MG PO TABS: 25.0000 mg | ORAL_TABLET | Freq: Every day | ORAL | 3 refills | Status: AC

## 2023-12-29 NOTE — Progress Notes (Signed)
 Cardiology Office Note   Date:  12/29/2023   ID:  Ronald Nguyen, DOB 10/22/1980, MRN [id number]  PCP:  Miki Kins, FNP  Cardiologist:  Adrian Blackwater, MD      History of Present Illness: Ronald Nguyen is a 43 y.o. male who presents for  Chief Complaint  Patient presents with   Follow-up    Follow up  Medication refills     No chest pain or SOB      Past Medical History:  Diagnosis Date   Hypertension, essential 07/03/2020   Idiopathic cardiomyopathy (HCC) 08/06/2020   Mixed hyperlipidemia 12/07/2022   Type 2 diabetes mellitus with hyperglycemia (HCC) 12/07/2022     History reviewed. No pertinent surgical history.   Current Outpatient Medications  Medication Sig Dispense Refill   carvedilol (COREG) 25 MG tablet TAKE 1 TABLET BY MOUTH TWICE DAILY 180 tablet 2   FARXIGA 10 MG TABS tablet TAKE 1 TABLET BY MOUTH EVERY DAY 30 tablet 3   ivabradine (CORLANOR) 5 MG TABS tablet TAKE 1 TABLET BY MOUTH TWICE DAILY 180 tablet 2   rosuvastatin (CRESTOR) 40 MG tablet TAKE 1 TABLET BY MOUTH EVERY DAY 90 tablet 3   sacubitril-valsartan (ENTRESTO) 97-103 MG TAKE 1 TABLET BY MOUTH TWICE DAILY 180 tablet 2   spironolactone (ALDACTONE) 25 MG tablet TAKE 1 TABLET BY MOUTH DAILY 90 tablet 0   metoprolol succinate (TOPROL XL) 50 MG 24 hr tablet Take 1 tablet (50 mg total) by mouth daily. Take with or immediately following a meal. 30 tablet 11   No current facility-administered medications for this visit.    Allergies:   Patient has no known allergies.    Social History:   reports that he has quit smoking. His smoking use included cigarettes. He has quit using smokeless tobacco. No history on file for alcohol use and drug use.   Family History:  family history is not on file.    ROS:     Review of Systems  Constitutional: Negative.   HENT: Negative.    Eyes: Negative.   Respiratory: Negative.    Gastrointestinal: Negative.   Genitourinary: Negative.    Musculoskeletal: Negative.   Skin: Negative.   Neurological: Negative.   Endo/Heme/Allergies: Negative.   Psychiatric/Behavioral: Negative.    All other systems reviewed and are negative.     All other systems are reviewed and negative.    PHYSICAL EXAM: VS:  BP 132/87   Pulse 85   Ht 5\' 6"  (1.676 m)   Wt 194 lb (88 kg)   SpO2 99%   BMI 31.31 kg/m  , BMI Body mass index is 31.31 kg/m. Last weight:  Wt Readings from Last 3 Encounters:  12/29/23 194 lb (88 kg)  01/11/23 206 lb 9.6 oz (93.7 kg)  12/07/22 199 lb (90.3 kg)     Physical Exam Vitals reviewed.  Constitutional:      Appearance: Normal appearance. He is normal weight.  HENT:     Head: Normocephalic.     Nose: Nose normal.     Mouth/Throat:     Mouth: Mucous membranes are moist.  Eyes:     Pupils: Pupils are equal, round, and reactive to light.  Cardiovascular:     Rate and Rhythm: Normal rate and regular rhythm.     Pulses: Normal pulses.     Heart sounds: Normal heart sounds.  Pulmonary:     Effort: Pulmonary effort is normal.  Abdominal:     General: Abdomen is  flat. Bowel sounds are normal.  Musculoskeletal:        General: Normal range of motion.     Cervical back: Normal range of motion.  Skin:    General: Skin is warm.  Neurological:     General: No focal deficit present.     Mental Status: He is alert.  Psychiatric:        Mood and Affect: Mood normal.       EKG:   Recent Labs: 01/11/2023: ALT 43; BUN 13; Creatinine, Ser 0.99; Hemoglobin 13.9; Potassium 4.7; Sodium 133; TSH 0.836    Lipid Panel    Component Value Date/Time   CHOL 151 01/11/2023 1037   TRIG 193 (H) 01/11/2023 1037   HDL 32 (L) 01/11/2023 1037   CHOLHDL 4.7 01/11/2023 1037   LDLCALC 86 01/11/2023 1037      Other studies Reviewed: Additional studies/ records that were reviewed today include:  Review of the above records demonstrates:       No data to display            ASSESSMENT AND PLAN:     ICD-10-CM   1. Mixed hyperlipidemia  E78.2 PCV ECHOCARDIOGRAM COMPLETE    2. Hypertension, essential  I10 PCV ECHOCARDIOGRAM COMPLETE    3. Severe left ventricular systolic dysfunction  I51.9 PCV ECHOCARDIOGRAM COMPLETE    4. Idiopathic cardiomyopathy (HCC)  I42.9 PCV ECHOCARDIOGRAM COMPLETE    5. Type 2 diabetes mellitus with hyperglycemia, without long-term current use of insulin (HCC)  E11.65 PCV ECHOCARDIOGRAM COMPLETE    6. Chronic systolic congestive heart failure (HCC)  I50.22 PCV ECHOCARDIOGRAM COMPLETE   compensated, feeling all GDMT, advise echo       Problem List Items Addressed This Visit       Cardiovascular and Mediastinum   Hypertension, essential   Relevant Orders   PCV ECHOCARDIOGRAM COMPLETE   Idiopathic cardiomyopathy (HCC)   Relevant Orders   PCV ECHOCARDIOGRAM COMPLETE   Severe left ventricular systolic dysfunction   Relevant Orders   PCV ECHOCARDIOGRAM COMPLETE     Endocrine   Type 2 diabetes mellitus with hyperglycemia (HCC)   Relevant Orders   PCV ECHOCARDIOGRAM COMPLETE     Other   Mixed hyperlipidemia - Primary   Relevant Orders   PCV ECHOCARDIOGRAM COMPLETE   Other Visit Diagnoses       Chronic systolic congestive heart failure (HCC)       compensated, feeling all GDMT, advise echo   Relevant Orders   PCV ECHOCARDIOGRAM COMPLETE          Disposition:   Return in about 4 weeks (around 01/26/2024) for echo and f/u.    Total time spent: 30 minutes  Signed,  Adrian Blackwater, MD  12/29/2023 10:30 AM    [facility]

## 2023-12-29 NOTE — Addendum Note (Signed)
 Addended by: Adrian Blackwater A on: 12/29/2023 10:31 AM   Modules accepted: Orders

## 2024-01-01 ENCOUNTER — Ambulatory Visit: Admitting: Family

## 2024-01-01 ENCOUNTER — Encounter: Payer: Self-pay | Admitting: Family

## 2024-01-01 VITALS — BP 118/84 | HR 78 | Ht 66.0 in | Wt 205.2 lb

## 2024-01-01 DIAGNOSIS — E782 Mixed hyperlipidemia: Secondary | ICD-10-CM | POA: Diagnosis not present

## 2024-01-01 DIAGNOSIS — Z1159 Encounter for screening for other viral diseases: Secondary | ICD-10-CM

## 2024-01-01 DIAGNOSIS — I10 Essential (primary) hypertension: Secondary | ICD-10-CM

## 2024-01-01 DIAGNOSIS — Z114 Encounter for screening for human immunodeficiency virus [HIV]: Secondary | ICD-10-CM

## 2024-01-01 DIAGNOSIS — E559 Vitamin D deficiency, unspecified: Secondary | ICD-10-CM

## 2024-01-01 DIAGNOSIS — E1165 Type 2 diabetes mellitus with hyperglycemia: Secondary | ICD-10-CM | POA: Diagnosis not present

## 2024-01-01 DIAGNOSIS — E538 Deficiency of other specified B group vitamins: Secondary | ICD-10-CM

## 2024-01-01 DIAGNOSIS — R7303 Prediabetes: Secondary | ICD-10-CM

## 2024-01-01 DIAGNOSIS — N6341 Unspecified lump in right breast, subareolar: Secondary | ICD-10-CM

## 2024-01-01 DIAGNOSIS — R5383 Other fatigue: Secondary | ICD-10-CM

## 2024-01-01 LAB — POC CREATINE & ALBUMIN,URINE
Creatinine, POC: 50 mg/dL
Microalbumin Ur, POC: 30 mg/L

## 2024-01-01 NOTE — Assessment & Plan Note (Signed)
 Checking labs today.  Continue current therapy for lipid control. Will modify as needed based on labwork results.

## 2024-01-01 NOTE — Assessment & Plan Note (Signed)
 Blood pressure well controlled with current medications.  Continue current therapy.  Will reassess at follow up.

## 2024-01-01 NOTE — Progress Notes (Signed)
 Established Patient Office Visit  Subjective:  Patient ID: Ronald Nguyen, male    DOB: June 22, 1981  Age: 43 y.o. MRN: [id number]  Chief Complaint  Patient presents with   Follow-up    1 year follow up    Patient is here today for his 6 months follow up.  He has been feeling well since last appointment.   He does not have additional concerns to discuss today.  Says that he doesn't have any specific concerns.  Labs are due today. He needs refills.   I have reviewed his active problem list, medication list, allergies, health maintenance, notes from last encounter, lab results for his appointment today.     No other concerns at this time.   Past Medical History:  Diagnosis Date   Hypertension, essential 07/03/2020   Idiopathic cardiomyopathy (HCC) 08/06/2020   Mixed hyperlipidemia 12/07/2022   Type 2 diabetes mellitus with hyperglycemia (HCC) 12/07/2022    History reviewed. No pertinent surgical history.  Social History   Socioeconomic History   Marital status: Married    Spouse name: Not on file   Number of children: Not on file   Years of education: Not on file   Highest education level: Not on file  Occupational History   Not on file  Tobacco Use   Smoking status: Former    Types: Cigarettes   Smokeless tobacco: Former  Substance and Sexual Activity   Alcohol use: Not on file   Drug use: Not on file   Sexual activity: Not on file  Other Topics Concern   Not on file  Social History Narrative   Not on file   Social Drivers of Health   Financial Resource Strain: Not on file  Food Insecurity: Not on file  Transportation Needs: Not on file  Physical Activity: Not on file  Stress: Not on file  Social Connections: Not on file  Intimate Partner Violence: Not on file    History reviewed. No pertinent family history.  No Known Allergies  Review of Systems  All other systems reviewed and are negative.      Objective:   BP 118/84   Pulse 78    Ht 5\' 6"  (1.676 m)   Wt 205 lb 3.2 oz (93.1 kg)   SpO2 97%   BMI 33.12 kg/m   Vitals:   01/01/24 0921  BP: 118/84  Pulse: 78  Height: 5\' 6"  (1.676 m)  Weight: 205 lb 3.2 oz (93.1 kg)  SpO2: 97%  BMI (Calculated): 33.14    Physical Exam Vitals and nursing note reviewed.  Constitutional:      Appearance: Normal appearance. He is normal weight.  HENT:     Head: Normocephalic and atraumatic.     Right Ear: Tympanic membrane normal.     Left Ear: Tympanic membrane normal.  Eyes:     Extraocular Movements: Extraocular movements intact.     Conjunctiva/sclera: Conjunctivae normal.     Pupils: Pupils are equal, round, and reactive to light.  Cardiovascular:     Rate and Rhythm: Normal rate and regular rhythm.     Pulses: Normal pulses.     Heart sounds: Normal heart sounds.  Pulmonary:     Effort: Pulmonary effort is normal.     Breath sounds: Normal breath sounds.  Musculoskeletal:        General: Normal range of motion.     Cervical back: Normal range of motion.  Neurological:     General: No focal deficit present.  Mental Status: He is alert and oriented to person, place, and time. Mental status is at baseline.  Psychiatric:        Mood and Affect: Mood normal.        Behavior: Behavior normal.        Thought Content: Thought content normal.        Judgment: Judgment normal.      Results for orders placed or performed in visit on 01/01/24  POC CREATINE & ALBUMIN,URINE  Result Value Ref Range   Microalbumin Ur, POC 30 mg/L   Creatinine, POC 50 mg/dL   Albumin/Creatinine Ratio, Urine, POC 30-300     Recent Results (from the past 2160 hours)  POC CREATINE & ALBUMIN,URINE     Status: None   Collection Time: 01/01/24 10:09 AM  Result Value Ref Range   Microalbumin Ur, POC 30 mg/L   Creatinine, POC 50 mg/dL   Albumin/Creatinine Ratio, Urine, POC 30-300        Assessment & Plan:   Problem List Items Addressed This Visit       Cardiovascular and  Mediastinum   Hypertension, essential   Blood pressure well controlled with current medications.  Continue current therapy.  Will reassess at follow up.        Relevant Orders   CMP14+EGFR   CBC with Diff     Endocrine   Type 2 diabetes mellitus with hyperglycemia (HCC)   Checking labs today. Will call pt. With results  Continue current diabetes POC, as patient has been well controlled on current regimen.  Will adjust meds if needed based on labs.        Relevant Orders   POC CREATINE & ALBUMIN,URINE (Completed)   CMP14+EGFR   Hemoglobin A1c   CBC with Diff     Other   Mixed hyperlipidemia - Primary   Checking labs today.  Continue current therapy for lipid control. Will modify as needed based on labwork results.        Relevant Orders   Lipid panel   CMP14+EGFR   CBC with Diff   Other Visit Diagnoses       Prediabetes       Relevant Orders   CMP14+EGFR   CBC with Diff     Need for hepatitis C screening test       Test ordered in office today. Will call with results.   Relevant Orders   CMP14+EGFR   CBC with Diff   Hepatitis C Ab reflex to Quant PCR     Screening for HIV without presence of risk factors       Test ordered in office today. Will call with results.   Relevant Orders   CMP14+EGFR   CBC with Diff   HIV antibody (with reflex)     Vitamin D deficiency, unspecified       Checking labs today.  Will continue supplements as needed.   Relevant Orders   VITAMIN D 25 Hydroxy (Vit-D Deficiency, Fractures)   CMP14+EGFR   CBC with Diff     B12 deficiency due to diet       Checking labs today.  Will continue supplements as needed.   Relevant Orders   CMP14+EGFR   Vitamin B12   CBC with Diff     Other fatigue       Relevant Orders   CMP14+EGFR   TSH   CBC with Diff     Subareolar lump of right breast  Sending for ultrasound. Will contact patient with results as needed   Relevant Orders   [place] LIMITED ULTRASOUND INCLUDING AXILLA RIGHT  BREAST       Return in about 4 months (around 05/02/2024) for F/U.   Total time spent: 20 minutes  Miki Kins, FNP  01/01/2024   This document may have been prepared by [facility] Voice Recognition software and as such may include unintentional dictation errors.

## 2024-01-01 NOTE — Assessment & Plan Note (Signed)
 Checking labs today. Will call pt. With results  Continue current diabetes POC, as patient has been well controlled on current regimen.  Will adjust meds if needed based on labs.

## 2024-01-03 LAB — CBC WITH DIFFERENTIAL/PLATELET
Basophils Absolute: 0 10*3/uL (ref 0.0–0.2)
Basos: 1 %
EOS (ABSOLUTE): 0.1 10*3/uL (ref 0.0–0.4)
Eos: 2 %
Hematocrit: 42.6 % (ref 37.5–51.0)
Hemoglobin: 14.1 g/dL (ref 13.0–17.7)
Immature Grans (Abs): 0 10*3/uL (ref 0.0–0.1)
Immature Granulocytes: 0 %
Lymphocytes Absolute: 1.2 10*3/uL (ref 0.7–3.1)
Lymphs: 29 %
MCH: 28 pg (ref 26.6–33.0)
MCHC: 33.1 g/dL (ref 31.5–35.7)
MCV: 85 fL (ref 79–97)
Monocytes Absolute: 0.3 10*3/uL (ref 0.1–0.9)
Monocytes: 7 %
Neutrophils Absolute: 2.4 10*3/uL (ref 1.4–7.0)
Neutrophils: 61 %
Platelets: 268 10*3/uL (ref 150–450)
RBC: 5.04 x10E6/uL (ref 4.14–5.80)
RDW: 13.8 % (ref 11.6–15.4)
WBC: 3.9 10*3/uL (ref 3.4–10.8)

## 2024-01-03 LAB — LIPID PANEL
Chol/HDL Ratio: 2.9 ratio (ref 0.0–5.0)
Cholesterol, Total: 141 mg/dL (ref 100–199)
HDL: 49 mg/dL (ref >39)
LDL Chol Calc (NIH): 76 mg/dL (ref 0–99)
Triglycerides: 82 mg/dL (ref 0–149)
VLDL Cholesterol Cal: 16 mg/dL (ref 5–40)

## 2024-01-03 LAB — CMP14+EGFR
ALT: 34 IU/L (ref 0–44)
AST: 20 IU/L (ref 0–40)
Albumin: 4.5 g/dL (ref 4.1–5.1)
Alkaline Phosphatase: 60 IU/L (ref 44–121)
BUN/Creatinine Ratio: 12 (ref 9–20)
BUN: 11 mg/dL (ref 6–24)
Bilirubin Total: 0.2 mg/dL (ref 0.0–1.2)
CO2: 22 mmol/L (ref 20–29)
Calcium: 9.6 mg/dL (ref 8.7–10.2)
Chloride: 104 mmol/L (ref 96–106)
Creatinine, Ser: 0.89 mg/dL (ref 0.76–1.27)
Globulin, Total: 2 g/dL (ref 1.5–4.5)
Glucose: 120 mg/dL — ABNORMAL HIGH (ref 70–99)
Potassium: 4.3 mmol/L (ref 3.5–5.2)
Sodium: 139 mmol/L (ref 134–144)
Total Protein: 6.5 g/dL (ref 6.0–8.5)
eGFR: 109 mL/min/{1.73_m2} (ref >59)

## 2024-01-03 LAB — HCV AB W REFLEX TO QUANT PCR: HCV Ab: NONREACTIVE

## 2024-01-03 LAB — TSH: TSH: 1.7 u[IU]/mL (ref 0.450–4.500)

## 2024-01-03 LAB — VITAMIN D 25 HYDROXY (VIT D DEFICIENCY, FRACTURES): Vit D, 25-Hydroxy: 22.8 ng/mL — ABNORMAL LOW (ref 30.0–100.0)

## 2024-01-03 LAB — HEMOGLOBIN A1C
Est. average glucose Bld gHb Est-mCnc: 160 mg/dL
Hgb A1c MFr Bld: 7.2 % — ABNORMAL HIGH (ref 4.8–5.6)

## 2024-01-03 LAB — VITAMIN B12: Vitamin B-12: 308 pg/mL (ref 232–1245)

## 2024-01-03 LAB — HCV INTERPRETATION

## 2024-01-03 LAB — HIV ANTIBODY (ROUTINE TESTING W REFLEX): HIV Screen 4th Generation wRfx: NONREACTIVE

## 2024-01-04 ENCOUNTER — Other Ambulatory Visit: Payer: Self-pay | Admitting: Family

## 2024-01-04 DIAGNOSIS — N6341 Unspecified lump in right breast, subareolar: Secondary | ICD-10-CM

## 2024-01-05 ENCOUNTER — Other Ambulatory Visit: Payer: Self-pay

## 2024-01-05 MED ORDER — VITAMIN D (ERGOCALCIFEROL) 1.25 MG (50000 UNIT) PO CAPS: 50000.0000 [IU] | ORAL_CAPSULE | ORAL | 3 refills | Status: AC

## 2024-01-26 ENCOUNTER — Other Ambulatory Visit

## 2024-01-26 ENCOUNTER — Other Ambulatory Visit: Payer: Self-pay | Admitting: Cardiovascular Disease

## 2024-01-26 DIAGNOSIS — I10 Essential (primary) hypertension: Secondary | ICD-10-CM

## 2024-01-26 DIAGNOSIS — E1165 Type 2 diabetes mellitus with hyperglycemia: Secondary | ICD-10-CM

## 2024-01-26 DIAGNOSIS — I5022 Chronic systolic (congestive) heart failure: Secondary | ICD-10-CM

## 2024-01-26 DIAGNOSIS — I5189 Other ill-defined heart diseases: Secondary | ICD-10-CM

## 2024-01-26 DIAGNOSIS — E782 Mixed hyperlipidemia: Secondary | ICD-10-CM

## 2024-01-26 DIAGNOSIS — I429 Cardiomyopathy, unspecified: Secondary | ICD-10-CM

## 2024-01-30 ENCOUNTER — Ambulatory Visit: Admitting: Cardiovascular Disease

## 2024-02-07 ENCOUNTER — Ambulatory Visit

## 2024-02-12 ENCOUNTER — Ambulatory Visit: Admitting: Cardiovascular Disease

## 2024-02-19 ENCOUNTER — Ambulatory Visit
Admission: RE | Admit: 2024-02-19 | Discharge: 2024-02-19 | Disposition: A | Discharge status: Home / Self Care | Source: Ambulatory Visit | Attending: Family | Admitting: Family

## 2024-02-19 DIAGNOSIS — N6341 Unspecified lump in right breast, subareolar: Secondary | ICD-10-CM | POA: Diagnosis present

## 2024-03-08 ENCOUNTER — Other Ambulatory Visit: Payer: Self-pay

## 2024-03-08 DIAGNOSIS — E1165 Type 2 diabetes mellitus with hyperglycemia: Secondary | ICD-10-CM

## 2024-03-08 MED ORDER — RYBELSUS 7 MG PO TABS: 7.0000 mg | ORAL_TABLET | Freq: Every day | ORAL | 0 refills | Status: AC

## 2024-04-11 ENCOUNTER — Other Ambulatory Visit: Payer: Self-pay | Admitting: Cardiovascular Disease

## 2024-04-15 ENCOUNTER — Other Ambulatory Visit: Payer: Self-pay | Admitting: Cardiovascular Disease

## 2024-05-02 ENCOUNTER — Ambulatory Visit: Admitting: Family

## 2024-06-23 ENCOUNTER — Other Ambulatory Visit: Payer: Self-pay | Admitting: Cardiovascular Disease

## 2024-07-12 ENCOUNTER — Other Ambulatory Visit: Payer: Self-pay | Admitting: Cardiovascular Disease

## 2024-09-05 ENCOUNTER — Other Ambulatory Visit: Payer: Self-pay | Admitting: Cardiovascular Disease

## 2024-09-09 ENCOUNTER — Other Ambulatory Visit: Payer: Self-pay

## 2024-10-26 ENCOUNTER — Other Ambulatory Visit: Payer: Self-pay | Admitting: Family
